# Patient Record
Sex: Male | Born: 1985 | Race: White | Hispanic: No | Marital: Married
Health system: Southern US, Community
[De-identification: ages and names within clinical notes are randomized; demographics above are authoritative.]

---
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
## 2007-01-20 ENCOUNTER — Emergency Department (HOSPITAL_COMMUNITY): Admission: EM | Admit: 2007-01-20 | Discharge: 2007-01-20 | Payer: Self-pay | Admitting: Emergency Medicine

## 2007-06-28 IMAGING — CR DG CHEST 1V PORT
1 series · 1 of 1 positions shown · non-contrast
Comparison: none

CLINICAL DATA: Chest pain and shortness of breath. 
 PORTABLE CHEST ? 1 VIEW:

[view not recorded]
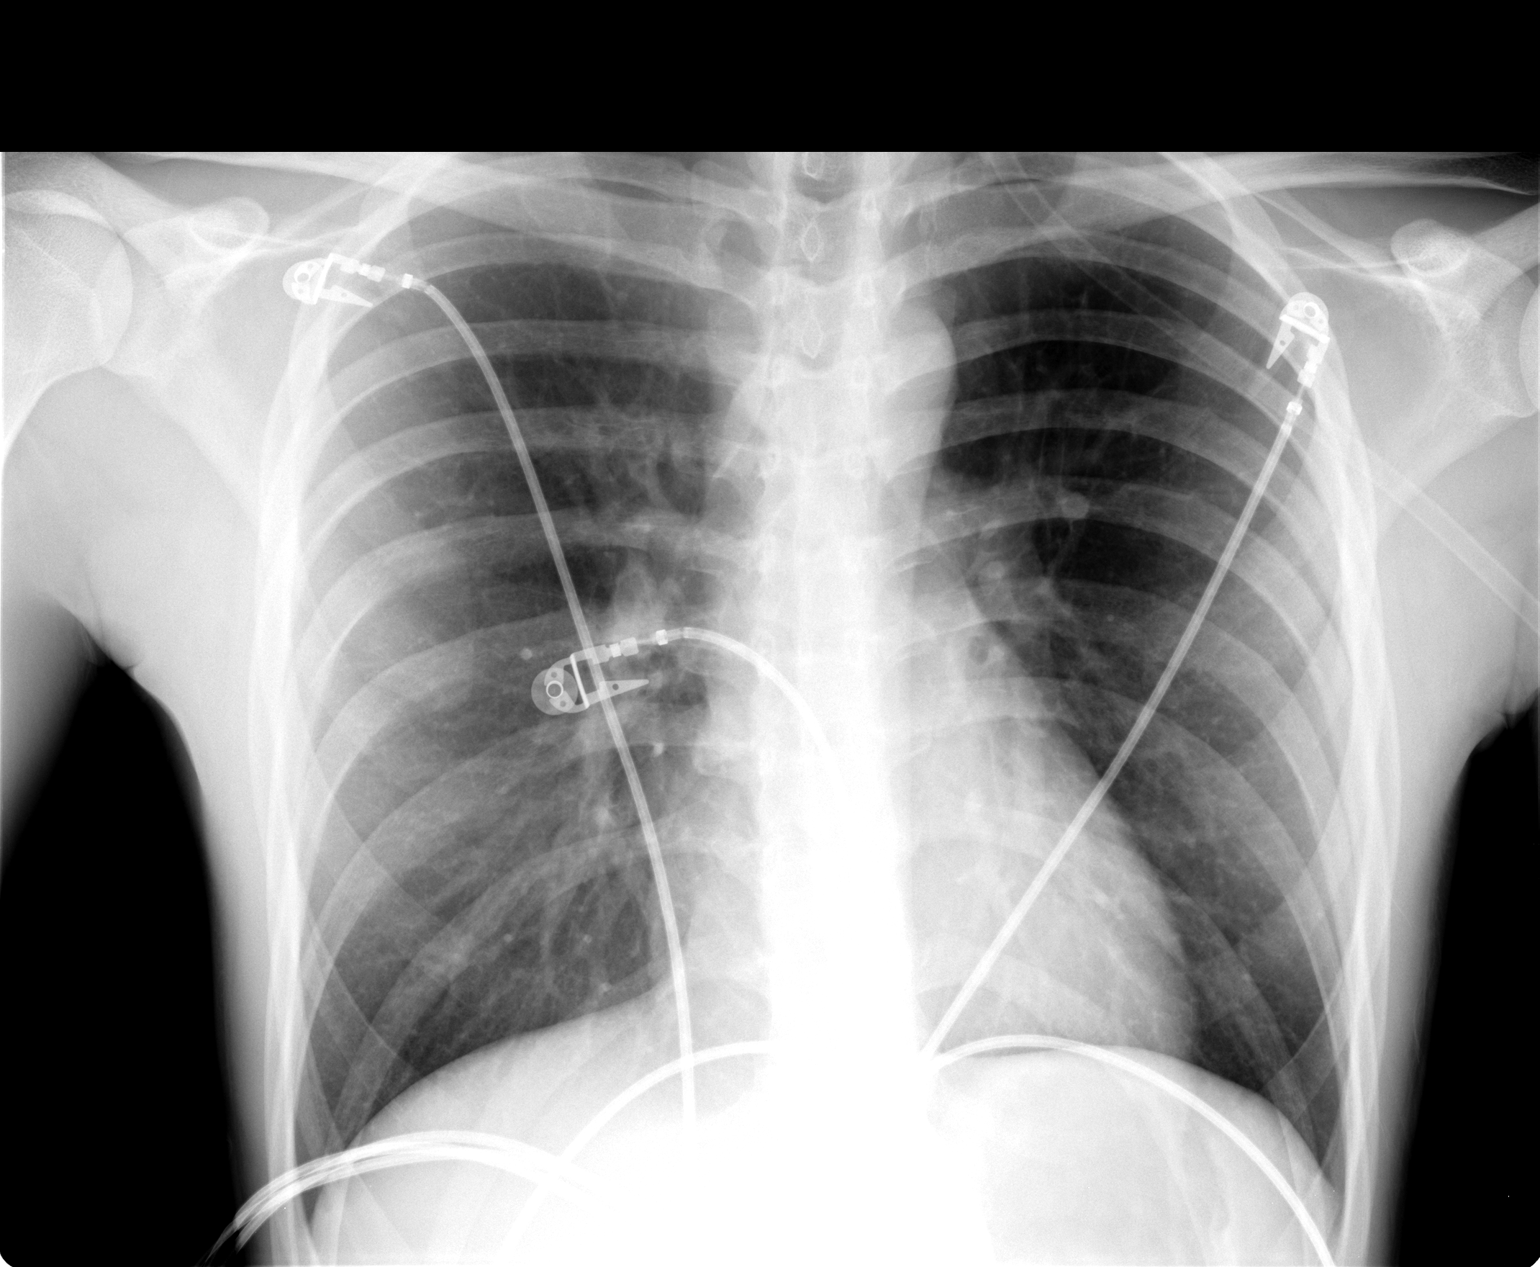

[1 of 1 positions shown; findings below may reference images not displayed]

FINDINGS: The lungs are clear.  Heart size is normal.  No effusion or focal bony abnormality.
IMPRESSION: Negative chest.

## 2017-05-19 ENCOUNTER — Emergency Department (HOSPITAL_COMMUNITY)
Admission: EM | Admit: 2017-05-19 | Discharge: 2017-05-19 | Disposition: A | Discharge status: Home / Self Care | Payer: Self-pay | Attending: Emergency Medicine | Admitting: Emergency Medicine

## 2017-05-19 DIAGNOSIS — Z5329 Procedure and treatment not carried out because of patient's decision for other reasons: Secondary | ICD-10-CM | POA: Insufficient documentation

## 2017-05-19 DIAGNOSIS — T50904A Poisoning by unspecified drugs, medicaments and biological substances, undetermined, initial encounter: Secondary | ICD-10-CM | POA: Insufficient documentation

## 2017-05-19 NOTE — ED Provider Notes (Deleted)
31 year old presents to the ED by EMS for unknown ingestion of substance. Felt the patient ingested unknown amount of narcotics. EMS states that he was cyanotic with normal respirations on scene. He was given 1 mg of intranasal Narcan. States that this caused him to be more alert and oriented. He reports that he did not want to be transported to the ED. Patient denies any SI, HI, auditory or visual hallucinations. He declines answer for any further questions regarding what he ingested. States that he is ready to go. Does have a ride that can transport him. He is alert and oriented 4. Normal decision-making capacity. Feel patient is stable for discharge. Patient did have ride in triage. His IV was DC'd. Patient was seen by my to Dr. Freida BusmanAllen who is agreeable for discharge this patient.   Rise MuLeaphart, Lorie Cleckley T, PA-C 05/19/17 1954

## 2017-05-19 NOTE — ED Provider Notes (Signed)
Medical screening examination/treatment/procedure(s) were conducted as a shared visit with non-physician practitioner(s) and myself.  I personally evaluated the patient during the encounter.   EKG Interpretation None     31 year old male here after having ingesting an unknown substance. It was responsive to Narcan. Patient denies this being a suicide attempt. He is alert and oriented 4 at this time. Has decision making capacity. Stable for discharge   Lorre NickAllen, Jazalynn Mireles, MD 05/19/17 1950

## 2017-05-19 NOTE — ED Triage Notes (Signed)
Pt presented by EMS and [employer] with reports indicated pt may have ingested an unknown amount narcotics, was found cyanotic but normal respirations on scene, had 1mg  of intranasal narcan at 1841hrs. Pt is currently alert and oriented. Medics vital signs indicate tachycardia 132bpm and received 500ml NS IVF. Pt is somewhat obtunded although AOx4. He states "he does not want to here." Declining to answer further questions regarding what he ingested or his medical hx.

## 2017-05-19 NOTE — ED Notes (Signed)
[facility] Expected date:  Expected time:  Means of arrival:  Comments: EMS 31 y/o OD on narcotics

## 2017-05-19 NOTE — ED Provider Notes (Signed)
[facility]-EMERGENCY DEPT Provider Note   CSN: [id number] Arrival date & time: 05/19/17  1910     History   Chief Complaint Chief Complaint  Patient presents with  . Drug Overdose    HPI Trevor Lyons is a 31 y.o. male.  HPI 31 year old Caucasian male witha past medical history presents to the ED today with complaints of ingestion of unknown substance. Patient was brought in by EMS. EMS reports the patient may have ingested an unknown amount of narcotics. He was initially cyanotic but with normal respirations on scene. He was given 1 mg of intranasal Narcan. Patient was responsive to Narcan. He is alert and oriented 4 this time. EMS states the patient was initially obtunded although this has resolved. Patient states he did not want to be transported to the ED. He declines answering any further questions regarding what he ingested or his medical history.  Patient denies any SI, HI, auditory or visual/hallucination.   Pt denies any fever, chill, ha, vision changes, lightheadedness, dizziness, congestion, neck pain, cp, sob, cough, abd pain, n/v/d, urinary symptoms, change in bowel habits, melena, hematochezia, lower extremity paresthesias.  No past medical history on file.  There are no active problems to display for this patient.   No past surgical history on file.     Home Medications    Prior to Admission medications   Not on File    Family History No family history on file.  Social History Social History  Substance Use Topics  . Smoking status: Not on file  . Smokeless tobacco: Not on file  . Alcohol use Not on file     Allergies   Patient has no allergy information on record.   Review of Systems Review of Systems  Constitutional: Negative for chills and fever.  HENT: Negative for congestion and sore throat.   Eyes: Negative for visual disturbance.  Respiratory: Negative for cough and shortness of breath.   Cardiovascular: Negative for chest pain.    Gastrointestinal: Negative for abdominal pain, diarrhea, nausea and vomiting.  Genitourinary: Negative for dysuria, flank pain, frequency, hematuria, scrotal swelling, testicular pain and urgency.  Musculoskeletal: Negative for arthralgias and myalgias.  Skin: Negative for rash.  Neurological: Negative for dizziness, syncope, weakness, light-headedness, numbness and headaches.  Psychiatric/Behavioral: Negative for sleep disturbance. The patient is not nervous/anxious.      Physical Exam Updated Vital Signs BP (!) 109/93 (BP Location: Left Arm)   Pulse (!) 114   Temp 100.2 F (37.9 C) (Oral)   Resp 16   SpO2 95%   Physical Exam  Constitutional: He is oriented to person, place, and time. He appears well-developed and well-nourished.  Non-toxic appearance. No distress.  HENT:  Head: Normocephalic and atraumatic.  Mouth/Throat: Oropharynx is clear and moist.  Eyes: Pupils are equal, round, and reactive to light. EOM are normal. Right eye exhibits no discharge. Left eye exhibits no discharge.  Conjunctivae injected bilaterally  Neck: Normal range of motion. Neck supple.  Cardiovascular: Regular rhythm, normal heart sounds and intact distal pulses.  Exam reveals no gallop and no friction rub.   No murmur heard. Tachycardia  Pulmonary/Chest: Effort normal and breath sounds normal. No respiratory distress. He has no wheezes. He has no rales. He exhibits no tenderness.  Abdominal: Soft. Bowel sounds are normal. He exhibits no distension. There is no tenderness. There is no rebound and no guarding.  Musculoskeletal: Normal range of motion. He exhibits no tenderness.  Lymphadenopathy:    He has no cervical adenopathy.  Neurological: He is alert and oriented to person, place, and time.  Skin: Skin is warm and dry. Capillary refill takes less than 2 seconds. No rash noted.  Psychiatric: His behavior is normal. Judgment and thought content normal.  Nursing note and vitals reviewed.    ED  Treatments / Results  Labs (all labs ordered are listed, but only abnormal results are displayed) Labs Reviewed - No data to display  EKG  EKG Interpretation None       Radiology No results found.  Procedures Procedures (including critical care time)  Medications Ordered in ED Medications - No data to display   Initial Impression / Assessment and Plan / ED Course  I have reviewed the triage vital signs and the nursing notes.  Pertinent labs & imaging results that were available during my care of the patient were reviewed by me and considered in my medical decision making (see chart for details).     Patient resents to the ED by EMS for possible ingestion of unknown substance. EMS reports possible ingestion of narcotics. He was given 1 round of Narcan and was responsive to this. He is alert and oriented 4 this time. He is declining any medical treatment. He has appropriate decision making capacity. He denies any SI, HI. The patient is stable at this time. IV was removed. He has a ride in the lobby and ready for discharge.   Pt seen and eval by Dr. Freida BusmanAllen who is agreeable to the above plan.   Final Clinical Impressions(s) / ED Diagnoses   Final diagnoses:  Drug overdose, undetermined intent, initial encounter    New Prescriptions There are no discharge medications for this patient.    Rise MuLeaphart, Anahla Bevis T, PA-C 05/19/17 2229
